# Patient Record
Sex: Male | Born: 1995 | Race: White | Hispanic: No | Marital: Single | Smoking: Current every day smoker
Health system: Southern US, Community
[De-identification: ages and names within clinical notes are randomized; demographics above are authoritative.]

## PROBLEM LIST (undated history)

## (undated) DIAGNOSIS — F909 Attention-deficit hyperactivity disorder, unspecified type: Secondary | ICD-10-CM

---
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
## 2014-04-16 ENCOUNTER — Encounter (HOSPITAL_COMMUNITY): Payer: Self-pay | Admitting: Cardiology

## 2014-04-16 ENCOUNTER — Emergency Department (HOSPITAL_COMMUNITY): Payer: Medicaid Other

## 2014-04-16 ENCOUNTER — Emergency Department (HOSPITAL_COMMUNITY)
Admission: EM | Admit: 2014-04-16 | Discharge: 2014-04-16 | Disposition: A | Discharge status: Home / Self Care | Payer: Medicaid Other | Attending: Emergency Medicine | Admitting: Emergency Medicine

## 2014-04-16 DIAGNOSIS — R197 Diarrhea, unspecified: Secondary | ICD-10-CM | POA: Diagnosis present

## 2014-04-16 DIAGNOSIS — Z720 Tobacco use: Secondary | ICD-10-CM | POA: Diagnosis not present

## 2014-04-16 DIAGNOSIS — Z8659 Personal history of other mental and behavioral disorders: Secondary | ICD-10-CM | POA: Diagnosis not present

## 2014-04-16 DIAGNOSIS — B349 Viral infection, unspecified: Secondary | ICD-10-CM | POA: Insufficient documentation

## 2014-04-16 MED ORDER — ONDANSETRON 4 MG PO TBDP: 4.0000 mg | ORAL_TABLET | Freq: Four times a day (QID) | ORAL | Status: DC | PRN

## 2014-04-16 MED ORDER — SODIUM CHLORIDE 0.9 % IV SOLN
1000.0000 mL | Freq: Once | INTRAVENOUS | Status: AC
  Administered 2014-04-16: 1000 mL via INTRAVENOUS

## 2014-04-16 MED ORDER — KETOROLAC TROMETHAMINE 30 MG/ML IJ SOLN
30.0000 mg | Freq: Once | INTRAMUSCULAR | Status: AC
  Administered 2014-04-16: 30 mg via INTRAVENOUS
  Filled 2014-04-16: qty 1

## 2014-04-16 MED ORDER — SODIUM CHLORIDE 0.9 % IV SOLN
1000.0000 mL | INTRAVENOUS | Status: DC
  Administered 2014-04-16: 1000 mL via INTRAVENOUS

## 2014-04-16 MED ORDER — ONDANSETRON HCL 4 MG/2ML IJ SOLN
4.0000 mg | Freq: Once | INTRAMUSCULAR | Status: AC
  Administered 2014-04-16: 4 mg via INTRAVENOUS
  Filled 2014-04-16: qty 2

## 2014-04-16 NOTE — ED Notes (Signed)
Nausea and diarrhea since yesterday morning.

## 2014-04-16 NOTE — ED Provider Notes (Signed)
CSN: [id number]     Arrival date & time 04/16/14  4098 History   First MD Initiated Contact with Patient 04/16/14 [phone]     Chief Complaint  Patient presents with  . Diarrhea     (Consider location/radiation/quality/duration/timing/severity/associated sxs/prior Treatment) Patient is a 19 y.o. male presenting with diarrhea. The history is provided by the patient.  Diarrhea Quality:  Watery Severity:  Moderate Number of episodes:  3 Duration:  1 day Timing:  Intermittent Progression:  Worsening Relieved by:  Nothing Ineffective treatments:  None tried Associated symptoms: chills, fever and headaches   Associated symptoms: no abdominal pain and no vomiting   Associated symptoms comment:  Nausea, weak, feeling tired and sleepy Risk factors: sick contacts   Risk factors: no recent antibiotic use, no suspicious food intake and no travel to endemic areas     Past Medical History  Diagnosis Date  . ADHD (attention deficit hyperactivity disorder)    History reviewed. No pertinent past surgical history. History reviewed. No pertinent family history. History  Substance Use Topics  . Smoking status: Current Every Day Smoker  . Smokeless tobacco: Not on file  . Alcohol Use: Yes     Comment: occasional    Review of Systems  Constitutional: Positive for fever and chills.  Gastrointestinal: Positive for nausea and diarrhea. Negative for vomiting, abdominal pain and blood in stool.  Neurological: Positive for headaches.      Allergies  Review of patient's allergies indicates no known allergies.  Home Medications   Prior to Admission medications   Medication Sig Start Date End Date Taking? Authorizing Provider  HYDROcodone-acetaminophen (NORCO/VICODIN) 5-325 MG per tablet Take 1 tablet by mouth every 4 (four) hours as needed for moderate pain or severe pain. 08/05/13   Zadie Rhine, MD  ibuprofen (ADVIL,MOTRIN) 600 MG tablet Take 1 tablet (600 mg total) by mouth every 6 (six)  hours as needed. 08/05/13   Zadie Rhine, MD   BP 114/99 mmHg  Pulse 63  Temp(Src) 98.8 F (37.1 C) (Oral)  Resp 14  Ht  (1.727 m)  Wt 200 lb (90.719 kg)  BMI 30.42 kg/m2  SpO2 96% Physical Exam  Constitutional: He is oriented to person, place, and time. He appears well-developed and well-nourished.  Non-toxic appearance.  HENT:  Head: Normocephalic.  Right Ear: Tympanic membrane and external ear normal.  Left Ear: Tympanic membrane and external ear normal.  Eyes: EOM and lids are normal. Pupils are equal, round, and reactive to light.  Neck: Normal range of motion. Neck supple. Carotid bruit is not present.  Cardiovascular: Normal rate, regular rhythm, normal heart sounds, intact distal pulses and normal pulses.   Pulmonary/Chest: No accessory muscle usage. No apnea and no tachypnea. No respiratory distress. He has rhonchi.  Abdominal: Soft. Bowel sounds are normal. There is no tenderness. There is no guarding.  Musculoskeletal: Normal range of motion.  Lymphadenopathy:       Head (right side): No submandibular adenopathy present.       Head (left side): No submandibular adenopathy present.    He has no cervical adenopathy.  Neurological: He is alert and oriented to person, place, and time. He has normal strength. No cranial nerve deficit or sensory deficit.  Skin: Skin is warm and dry.  Psychiatric: He has a normal mood and affect. His speech is normal.  Nursing note and vitals reviewed.   ED Course  Procedures (including critical care time) Labs Review Labs Reviewed - No data to display  Imaging Review No results found.   EKG Interpretation None      MDM  Vital signs stable.  No nausea or diarrhea after zofran and IV fluids. After fluids and meds, pt ambulatory without problem. States he feels better. Chest xray is negative for acute problem.   Final diagnoses:  None    *I have reviewed nursing notes, vital signs, and all appropriate lab and imaging  results for this patient.[address]**    Ohanna Gassert, PA-C 04/18/14 0001  Shon Batonourtney F Horton, MD 04/19/14 1452

## 2014-04-16 NOTE — Discharge Instructions (Signed)

## 2014-04-23 ENCOUNTER — Emergency Department (HOSPITAL_COMMUNITY)
Admission: EM | Admit: 2014-04-23 | Discharge: 2014-04-23 | Disposition: A | Discharge status: Home / Self Care | Payer: Medicaid Other | Attending: Emergency Medicine | Admitting: Emergency Medicine

## 2014-04-23 ENCOUNTER — Encounter (HOSPITAL_COMMUNITY): Payer: Self-pay | Admitting: *Deleted

## 2014-04-23 DIAGNOSIS — B349 Viral infection, unspecified: Secondary | ICD-10-CM | POA: Diagnosis not present

## 2014-04-23 DIAGNOSIS — Z791 Long term (current) use of non-steroidal anti-inflammatories (NSAID): Secondary | ICD-10-CM | POA: Diagnosis not present

## 2014-04-23 DIAGNOSIS — Z79899 Other long term (current) drug therapy: Secondary | ICD-10-CM | POA: Diagnosis not present

## 2014-04-23 DIAGNOSIS — Z720 Tobacco use: Secondary | ICD-10-CM | POA: Insufficient documentation

## 2014-04-23 DIAGNOSIS — Z8659 Personal history of other mental and behavioral disorders: Secondary | ICD-10-CM | POA: Insufficient documentation

## 2014-04-23 DIAGNOSIS — R51 Headache: Secondary | ICD-10-CM | POA: Diagnosis present

## 2014-04-23 NOTE — ED Notes (Signed)
Alert, NAd, says he needs a work note, left work earlier today due to headache.  Needs a note to return to work.

## 2014-04-23 NOTE — Discharge Instructions (Signed)
Please use Tylenol every 4 hours, or ibuprofen every 6 hours for headache. Please increase fluids (water and juices and Gatorade). Please see Dr Lysbeth GalasNyland for follow up in the office of your illness. Viral Infections A virus is a type of germ. Viruses can cause:  Minor sore throats.  Aches and pains.  Headaches.  Runny nose.  Rashes.  Watery eyes.  Tiredness.  Coughs.  Loss of appetite.  Feeling sick to your stomach (nausea).  Throwing up (vomiting).  Watery poop (diarrhea). HOME CARE   Only take medicines as told by your doctor.  Drink enough water and fluids to keep your pee (urine) clear or pale yellow. Sports drinks are a good choice.  Get plenty of rest and eat healthy. Soups and broths with crackers or rice are fine. GET HELP RIGHT AWAY IF:   You have a very bad headache.  You have shortness of breath.  You have chest pain or neck pain.  You have an unusual rash.  You cannot stop throwing up.  You have watery poop that does not stop.  You cannot keep fluids down.  You or your child has a temperature by mouth above 102 F (38.9 C), not controlled by medicine.  Your baby is older than 3 months with a rectal temperature of 102 F (38.9 C) or higher.  Your baby is 943 months old or younger with a rectal temperature of 100.4 F (38 C) or higher. MAKE SURE YOU:   Understand these instructions.  Will watch this condition.  Will get help right away if you are not doing well or get worse. Document Released: 12/04/2007 Document Revised: 03/15/2011 Document Reviewed: 04/28/2010 [facility] Patient Information 2015 [place], [place]. This information is not intended to replace advice given to you by your health care provider. Make sure you discuss any questions you have with your health care provider.

## 2014-04-23 NOTE — ED Notes (Signed)
Pt states he started a new job yesterday and tonight he went to work. Pt is supposed to wear a pair of safety glasses. Pt states they made him have a headache so pt had to leave work. Pt states he went home and had 1 episode of emesis. Pt states his boss told him he would need a note to come back tomorrow. Pt is here for a work note.

## 2014-04-23 NOTE — ED Provider Notes (Signed)
CSN: [id number]     Arrival date & time 04/23/14  2053 History   First MD Initiated Contact with Patient 04/23/14 2216     Chief Complaint  Patient presents with  . work note      (Consider location/radiation/quality/duration/timing/severity/associated sxs/prior Treatment) HPI Comments: Patient is a 19 year old male who presents to the emergency department with a complaint of headache, nausea, and vomiting. This started on yesterday while the patient was at work. The patient states that he thinks that some of this may have been stimulated because he was in a very high heat area. He also feels that the safety glasses that he had to wear may have fostered his headache. He states the headache was mostly between his eyes, and felt like a great guilt of pressure. This was then accompanied by a tight sensation in the temporal areas. The patient had an episode of vomiting. He had episode of generally not feeling well for several hours on yesterday and this carried over into today. He states that he was told he would need a work note to return to work duty. He states he was unable to see his primary physician and came here since he was recently seen on April 12 for diarrhea and viral problems.  The history is provided by the patient.    Past Medical History  Diagnosis Date  . ADHD (attention deficit hyperactivity disorder)    History reviewed. No pertinent past surgical history. History reviewed. No pertinent family history. History  Substance Use Topics  . Smoking status: Current Every Day Smoker  . Smokeless tobacco: Not on file  . Alcohol Use: Yes     Comment: occasional    Review of Systems  Gastrointestinal: Positive for nausea and vomiting.  Neurological: Positive for headaches.  All other systems reviewed and are negative.     Allergies  Review of patient's allergies indicates no known allergies.  Home Medications   Prior to Admission medications   Medication Sig Start Date  End Date Taking? Authorizing Provider  acetaminophen (TYLENOL) 500 MG tablet Take 1,000 mg by mouth every 6 (six) hours as needed for mild pain, fever or headache.    Historical Provider, MD  bismuth subsalicylate (PEPTO BISMOL) 262 MG/15ML suspension Take 30 mLs by mouth every 6 (six) hours as needed (nausea/upset stomach).    Historical Provider, MD  HYDROcodone-acetaminophen (NORCO/VICODIN) 5-325 MG per tablet Take 1 tablet by mouth every 4 (four) hours as needed for moderate pain or severe pain. Patient not taking: Reported on 04/16/2014 08/05/13   Zadie Rhineonald Wickline, MD  ibuprofen (ADVIL,MOTRIN) 600 MG tablet Take 1 tablet (600 mg total) by mouth every 6 (six) hours as needed. Patient not taking: Reported on 04/16/2014 08/05/13   Zadie Rhineonald Wickline, MD  ondansetron (ZOFRAN ODT) 4 MG disintegrating tablet Take 1 tablet (4 mg total) by mouth every 6 (six) hours as needed for nausea or vomiting. 04/16/14   Ivery QualeHobson Caty Tessler, PA-C  Pseudoeph-Doxylamine-DM-APAP (DAYQUIL/NYQUIL COLD/FLU RELIEF PO) Take 2 capsules by mouth every 6 (six) hours as needed (cold/flu-like symptoms).    Historical Provider, MD   BP 130/66 mmHg  Pulse 88  Temp(Src) 98.6 F (37 C) (Oral)  Resp 18  Ht 5\' 8"  (1.727 m)  Wt 200 lb (90.719 kg)  BMI 30.42 kg/m2  SpO2 100% Physical Exam  Constitutional: He is oriented to person, place, and time. He appears well-developed and well-nourished.  Non-toxic appearance.  HENT:  Head: Normocephalic.  Right Ear: Tympanic membrane and external ear normal.  Left Ear: Tympanic membrane and external ear normal.  Eyes: EOM and lids are normal. Pupils are equal, round, and reactive to light.  Neck: Normal range of motion. Neck supple. Carotid bruit is not present.  Cardiovascular: Normal rate, regular rhythm, normal heart sounds, intact distal pulses and normal pulses.   Pulmonary/Chest: Breath sounds normal. No respiratory distress.  Abdominal: Soft. Bowel sounds are normal. There is no tenderness.  There is no guarding.  Musculoskeletal: Normal range of motion.  Lymphadenopathy:       Head (right side): No submandibular adenopathy present.       Head (left side): No submandibular adenopathy present.    He has no cervical adenopathy.  Neurological: He is alert and oriented to person, place, and time. He has normal strength. No cranial nerve deficit or sensory deficit.  Skin: Skin is warm and dry.  Psychiatric: He has a normal mood and affect. His speech is normal.  Nursing note and vitals reviewed.   ED Course  Procedures (including critical care time) Labs Review Labs Reviewed - No data to display  Imaging Review No results found.   EKG Interpretation None      MDM  Patient reports onset of headache and vomiting on yesterday. He had body aches and generally not feeling well on yesterday and carried over into today. At this time the vital signs are well within normal limits. Pulse oximetry is 100% on room air. No acute findings on the examination. Feels that it is safe for the patient to return to work duty on tomorrow April 20.    Final diagnoses:  None    **I have reviewed nursing notes, vital signs, and all appropriate lab and imaging results for this patient.    Ivery Quale, PA-C 04/23/14 2247  Raeford Razor, MD 04/23/14 [id number]

## 2014-04-24 ENCOUNTER — Encounter (HOSPITAL_COMMUNITY): Payer: Self-pay

## 2014-04-24 ENCOUNTER — Emergency Department (HOSPITAL_COMMUNITY)
Admission: EM | Admit: 2014-04-24 | Discharge: 2014-04-24 | Disposition: A | Discharge status: Home / Self Care | Payer: Medicaid Other | Attending: Emergency Medicine | Admitting: Emergency Medicine

## 2014-04-24 DIAGNOSIS — Z720 Tobacco use: Secondary | ICD-10-CM | POA: Diagnosis not present

## 2014-04-24 DIAGNOSIS — R0981 Nasal congestion: Secondary | ICD-10-CM | POA: Insufficient documentation

## 2014-04-24 DIAGNOSIS — R51 Headache: Secondary | ICD-10-CM | POA: Insufficient documentation

## 2014-04-24 DIAGNOSIS — Z79899 Other long term (current) drug therapy: Secondary | ICD-10-CM | POA: Insufficient documentation

## 2014-04-24 DIAGNOSIS — Z8659 Personal history of other mental and behavioral disorders: Secondary | ICD-10-CM | POA: Insufficient documentation

## 2014-04-24 DIAGNOSIS — K088 Other specified disorders of teeth and supporting structures: Secondary | ICD-10-CM | POA: Insufficient documentation

## 2014-04-24 DIAGNOSIS — K0889 Other specified disorders of teeth and supporting structures: Secondary | ICD-10-CM

## 2014-04-24 MED ORDER — IBUPROFEN 600 MG PO TABS: 600.0000 mg | ORAL_TABLET | Freq: Four times a day (QID) | ORAL | Status: DC | PRN

## 2014-04-24 MED ORDER — IBUPROFEN 800 MG PO TABS
800.0000 mg | ORAL_TABLET | Freq: Once | ORAL | Status: AC
  Administered 2014-04-24: 800 mg via ORAL
  Filled 2014-04-24: qty 1

## 2014-04-24 MED ORDER — AMOXICILLIN 500 MG PO CAPS: 500.0000 mg | ORAL_CAPSULE | Freq: Three times a day (TID) | ORAL | Status: DC

## 2014-04-24 MED ORDER — AMOXICILLIN 250 MG PO CAPS
500.0000 mg | ORAL_CAPSULE | Freq: Once | ORAL | Status: AC
  Administered 2014-04-24: 500 mg via ORAL
  Filled 2014-04-24: qty 2

## 2014-04-24 NOTE — ED Notes (Signed)
Pt reports was here yesterday for v/d and requesting a note for work.  Reports today has a toothache and wants to be evaluated for that as well.

## 2014-04-24 NOTE — ED Provider Notes (Signed)
CSN: [id number]     Arrival date & time 04/24/14  1628 History   None    Chief Complaint  Patient presents with  . Dental Pain     (Consider location/radiation/quality/duration/timing/severity/associated sxs/prior Treatment) Patient is a 19 y.o. male presenting with tooth pain. The history is provided by the patient.  Dental Pain Location:  Upper Quality:  Aching Severity:  Moderate Onset quality:  Gradual Duration:  1 day Timing:  Constant Progression:  Worsening Chronicity:  Chronic Context: dental caries and poor dentition   Relieved by:  Nothing Ineffective treatments:  None tried Associated symptoms: congestion and headaches   Associated symptoms: no difficulty swallowing and no trismus   Risk factors: lack of dental care   Risk factors: no immunosuppression     Past Medical History  Diagnosis Date  . ADHD (attention deficit hyperactivity disorder)    History reviewed. No pertinent past surgical history. No family history on file. History  Substance Use Topics  . Smoking status: Current Every Day Smoker  . Smokeless tobacco: Not on file  . Alcohol Use: Yes     Comment: occasional    Review of Systems  HENT: Positive for congestion and dental problem.   Neurological: Positive for headaches.  All other systems reviewed and are negative.     Allergies  Review of patient's allergies indicates no known allergies.  Home Medications   Prior to Admission medications   Medication Sig Start Date End Date Taking? Authorizing Provider  acetaminophen (TYLENOL) 500 MG tablet Take 1,000 mg by mouth every 6 (six) hours as needed for mild pain, fever or headache.   Yes Historical Provider, MD  bismuth subsalicylate (PEPTO BISMOL) 262 MG/15ML suspension Take 30 mLs by mouth every 6 (six) hours as needed (nausea/upset stomach).    Historical Provider, MD  HYDROcodone-acetaminophen (NORCO/VICODIN) 5-325 MG per tablet Take 1 tablet by mouth every 4 (four) hours as needed  for moderate pain or severe pain. Patient not taking: Reported on 04/16/2014 08/05/13   Zadie Rhine, MD  ibuprofen (ADVIL,MOTRIN) 600 MG tablet Take 1 tablet (600 mg total) by mouth every 6 (six) hours as needed. Patient not taking: Reported on 04/16/2014 08/05/13   Zadie Rhine, MD  ondansetron (ZOFRAN ODT) 4 MG disintegrating tablet Take 1 tablet (4 mg total) by mouth every 6 (six) hours as needed for nausea or vomiting. Patient not taking: Reported on 04/24/2014 04/16/14   Ivery Quale, PA-C  Pseudoeph-Doxylamine-DM-APAP (DAYQUIL/NYQUIL COLD/FLU RELIEF PO) Take 2 capsules by mouth every 6 (six) hours as needed (cold/flu-like symptoms).    Historical Provider, MD   BP 123/63 mmHg  Pulse 88  Temp(Src) 99 F (37.2 C) (Oral)  Resp 14  Ht  (1.727 m)  Wt 200 lb (90.719 kg)  BMI 30.42 kg/m2  SpO2 100% Physical Exam  Constitutional: He is oriented to person, place, and time. He appears well-developed and well-nourished.  Non-toxic appearance.  HENT:  Head: Normocephalic.  Right Ear: Tympanic membrane and external ear normal.  Left Ear: Tympanic membrane and external ear normal.  Mild swelling of the left lower gum. No abscess. Tender to percussion of the 1st ans 2nd molar. Airway patent.  Eyes: EOM and lids are normal. Pupils are equal, round, and reactive to light.  Neck: Normal range of motion. Neck supple. Carotid bruit is not present.  Cardiovascular: Normal rate, regular rhythm, normal heart sounds, intact distal pulses and normal pulses.   Pulmonary/Chest: Breath sounds normal. No respiratory distress.  Abdominal: Soft. Bowel  sounds are normal. There is no tenderness. There is no guarding.  Musculoskeletal: Normal range of motion.  Lymphadenopathy:       Head (right side): No submandibular adenopathy present.       Head (left side): No submandibular adenopathy present.    He has no cervical adenopathy.  Neurological: He is alert and oriented to person, place, and time. He has  normal strength. No cranial nerve deficit or sensory deficit.  Skin: Skin is warm and dry.  Psychiatric: He has a normal mood and affect. His speech is normal.  Nursing note and vitals reviewed.   ED Course  Procedures (including critical care time) Labs Review Labs Reviewed - No data to display  Imaging Review No results found.   EKG Interpretation None      MDM  Vital signs stable. Pt c/o toothache left lower jaw area. Pt treated with ibuprofen and amoxil. Pt was seen in ED yesterday for upper respiratory symptoms and requesting a work note. Discussed with pt need for evaluation with PCP for non-emergent medical needs and work notes.   Final diagnoses:  None    **I have reviewed nursing notes, vital signs, and all appropriate lab and imaging results for this patient.Ivery Quale*    Jayden Rudge, PA-C 04/24/14 1806  Samuel JesterKathleen McManus, DO 04/25/14 2215

## 2014-04-24 NOTE — Discharge Instructions (Signed)
Use ibuprofen and amoxil for dental pain. See Dr Lysbeth GalasNyland for any additional medical issues or work notes. Dental Pain A tooth ache may be caused by cavities (tooth decay). Cavities expose the nerve of the tooth to air and hot or cold temperatures. It may come from an infection or abscess (also called a boil or furuncle) around your tooth. It is also often caused by dental caries (tooth decay). This causes the pain you are having. DIAGNOSIS  Your caregiver can diagnose this problem by exam. TREATMENT   If caused by an infection, it may be treated with medications which kill germs (antibiotics) and pain medications as prescribed by your caregiver. Take medications as directed.  Only take over-the-counter or prescription medicines for pain, discomfort, or fever as directed by your caregiver.  Whether the tooth ache today is caused by infection or dental disease, you should see your dentist as soon as possible for further care. SEEK MEDICAL CARE IF: The exam and treatment you received today has been provided on an emergency basis only. This is not a substitute for complete medical or dental care. If your problem worsens or new problems (symptoms) appear, and you are unable to meet with your dentist, call or return to this location. SEEK IMMEDIATE MEDICAL CARE IF:   You have a fever.  You develop redness and swelling of your face, jaw, or neck.  You are unable to open your mouth.  You have severe pain uncontrolled by pain medicine. MAKE SURE YOU:   Understand these instructions.  Will watch your condition.  Will get help right away if you are not doing well or get worse. Document Released: 12/21/2004 Document Revised: 03/15/2011 Document Reviewed: 08/09/2007 [facility] Patient Information 2015 [employer], [place]. This information is not intended to replace advice given to you by your health care provider. Make sure you discuss any questions you have with your health care provider.

## 2015-07-16 ENCOUNTER — Emergency Department (HOSPITAL_COMMUNITY)
Admission: EM | Admit: 2015-07-16 | Discharge: 2015-07-16 | Disposition: A | Discharge status: Home / Self Care | Payer: Medicaid Other | Attending: Emergency Medicine | Admitting: Emergency Medicine

## 2015-07-16 ENCOUNTER — Encounter (HOSPITAL_COMMUNITY): Payer: Self-pay

## 2015-07-16 DIAGNOSIS — F909 Attention-deficit hyperactivity disorder, unspecified type: Secondary | ICD-10-CM | POA: Insufficient documentation

## 2015-07-16 DIAGNOSIS — Z792 Long term (current) use of antibiotics: Secondary | ICD-10-CM | POA: Insufficient documentation

## 2015-07-16 DIAGNOSIS — F17200 Nicotine dependence, unspecified, uncomplicated: Secondary | ICD-10-CM | POA: Insufficient documentation

## 2015-07-16 DIAGNOSIS — R1012 Left upper quadrant pain: Secondary | ICD-10-CM | POA: Insufficient documentation

## 2015-07-16 DIAGNOSIS — Z79899 Other long term (current) drug therapy: Secondary | ICD-10-CM | POA: Insufficient documentation

## 2015-07-16 LAB — URINALYSIS, ROUTINE W REFLEX MICROSCOPIC
Bilirubin Urine: NEGATIVE
Glucose, UA: NEGATIVE mg/dL
HGB URINE DIPSTICK: NEGATIVE
Ketones, ur: NEGATIVE mg/dL
LEUKOCYTES UA: NEGATIVE
NITRITE: NEGATIVE
PROTEIN: NEGATIVE mg/dL
Specific Gravity, Urine: 1.025 (ref 1.005–1.030)
pH: 6 (ref 5.0–8.0)

## 2015-07-16 NOTE — Discharge Instructions (Signed)

## 2015-07-16 NOTE — ED Notes (Signed)
Complain of upper abdominal pain. Denies vomiting and diarrhea. States he drank three beers last night.

## 2015-07-16 NOTE — ED Provider Notes (Signed)
CSN: [id number]     Arrival date & time 07/16/15  0912 History   First MD Initiated Contact with Patient 07/16/15 [phone]     Chief Complaint  Patient presents with  . Abdominal Pain     (Consider location/radiation/quality/duration/timing/severity/associated sxs/prior Treatment) Patient is a 20 y.o. male presenting with abdominal pain. The history is provided by the patient. No language interpreter was used.  Abdominal Pain Pain location:  LUQ Pain quality: aching   Pain radiates to:  Does not radiate Timing:  Constant Progression:  Worsening Chronicity:  New Context: not alcohol use   Relieved by:  Nothing Worsened by:  Nothing tried Ineffective treatments:  None tried Associated symptoms: no vomiting   Risk factors: has not had multiple surgeries     Past Medical History  Diagnosis Date  . ADHD (attention deficit hyperactivity disorder)    History reviewed. No pertinent past surgical history. No family history on file. Social History  Substance Use Topics  . Smoking status: Current Every Day Smoker  . Smokeless tobacco: None  . Alcohol Use: Yes     Comment: occasional    Review of Systems  Gastrointestinal: Positive for abdominal pain. Negative for vomiting.  All other systems reviewed and are negative.     Allergies  Review of patient's allergies indicates no known allergies.  Home Medications   Prior to Admission medications   Medication Sig Start Date End Date Taking? Authorizing Provider  acetaminophen (TYLENOL) 500 MG tablet Take 1,000 mg by mouth every 6 (six) hours as needed for mild pain, fever or headache.    Historical Provider, MD  amoxicillin (AMOXIL) 500 MG capsule Take 1 capsule (500 mg total) by mouth 3 (three) times daily. 04/24/14   Ivery QualeHobson Bryant, PA-C  bismuth subsalicylate (PEPTO BISMOL) 262 MG/15ML suspension Take 30 mLs by mouth every 6 (six) hours as needed (nausea/upset stomach).    Historical Provider, MD  HYDROcodone-acetaminophen  (NORCO/VICODIN) 5-325 MG per tablet Take 1 tablet by mouth every 4 (four) hours as needed for moderate pain or severe pain. Patient not taking: Reported on 04/16/2014 08/05/13   Zadie Rhineonald Wickline, MD  ibuprofen (ADVIL,MOTRIN) 600 MG tablet Take 1 tablet (600 mg total) by mouth every 6 (six) hours as needed. Use for toothache pain 04/24/14   Ivery QualeHobson Bryant, PA-C  ondansetron (ZOFRAN ODT) 4 MG disintegrating tablet Take 1 tablet (4 mg total) by mouth every 6 (six) hours as needed for nausea or vomiting. Patient not taking: Reported on 04/24/2014 04/16/14   Ivery QualeHobson Bryant, PA-C  Pseudoeph-Doxylamine-DM-APAP (DAYQUIL/NYQUIL COLD/FLU RELIEF PO) Take 2 capsules by mouth every 6 (six) hours as needed (cold/flu-like symptoms).    Historical Provider, MD   BP 141/88 mmHg  Pulse 86  Temp(Src) 98.1 F (36.7 C) (Oral)  Resp 16  Ht 5\' 9"  (1.753 m)  Wt 99.791 kg  BMI 32.47 kg/m2  SpO2 96% Physical Exam  Constitutional: He is oriented to person, place, and time. He appears well-developed and well-nourished.  HENT:  Head: Normocephalic.  Right Ear: External ear normal.  Left Ear: External ear normal.  Nose: Nose normal.  Mouth/Throat: Oropharynx is clear and moist.  Eyes: EOM are normal. Pupils are equal, round, and reactive to light.  Neck: Normal range of motion.  Cardiovascular: Normal rate.   Pulmonary/Chest: Effort normal and breath sounds normal.  Abdominal: Soft. He exhibits no distension. There is tenderness.  Musculoskeletal: Normal range of motion.  Neurological: He is alert and oriented to person, place, and time.  Skin:  Skin is warm.  Psychiatric: He has a normal mood and affect.  Nursing note and vitals reviewed.   ED Course  Procedures (including critical care time) Labs Review Labs Reviewed  CBC WITH DIFFERENTIAL/PLATELET  COMPREHENSIVE METABOLIC PANEL  LIPASE, BLOOD  URINALYSIS, ROUTINE W REFLEX MICROSCOPIC (NOT AT [facility])    Imaging Review No results found. I have personally  reviewed and evaluated these images and lab results as part of my medical decision-making.   EKG Interpretation None      MDM  Pt  refused blood work or further evaluation.  Pt reports pain has pasted.  Pt asked for a note for his parole officer.  Pt reports he missed parole meeting due to coming in here and request a note stating he was here.   Final diagnoses:  Left upper quadrant pain    Pt advised he can try pepcid.  I advised him if he is really having pain and it returns/worsens he need to return for evaluation.    Lonia Skinner [place], PA-C 07/16/15 1050  Jacalyn Lefevre, MD 07/16/15 [phone]

## 2019-05-26 ENCOUNTER — Other Ambulatory Visit: Payer: Self-pay

## 2019-05-26 ENCOUNTER — Encounter (HOSPITAL_COMMUNITY): Payer: Self-pay | Admitting: Emergency Medicine

## 2019-05-26 DIAGNOSIS — F17210 Nicotine dependence, cigarettes, uncomplicated: Secondary | ICD-10-CM | POA: Insufficient documentation

## 2019-05-26 DIAGNOSIS — R1031 Right lower quadrant pain: Secondary | ICD-10-CM | POA: Insufficient documentation

## 2019-05-26 DIAGNOSIS — N50811 Right testicular pain: Secondary | ICD-10-CM | POA: Insufficient documentation

## 2019-05-26 NOTE — ED Triage Notes (Signed)
Pt c/o testicle pain that started today.

## 2019-05-27 ENCOUNTER — Emergency Department (HOSPITAL_COMMUNITY)
Admission: EM | Admit: 2019-05-27 | Discharge: 2019-05-27 | Disposition: A | Discharge status: Home / Self Care | Payer: Self-pay | Attending: Emergency Medicine | Admitting: Emergency Medicine

## 2019-05-27 ENCOUNTER — Emergency Department (HOSPITAL_COMMUNITY): Payer: Self-pay

## 2019-05-27 DIAGNOSIS — N50811 Right testicular pain: Secondary | ICD-10-CM

## 2019-05-27 LAB — URINALYSIS, ROUTINE W REFLEX MICROSCOPIC
Bilirubin Urine: NEGATIVE
Glucose, UA: NEGATIVE mg/dL
Hgb urine dipstick: NEGATIVE
Ketones, ur: NEGATIVE mg/dL
Leukocytes,Ua: NEGATIVE
Nitrite: NEGATIVE
Protein, ur: NEGATIVE mg/dL
Specific Gravity, Urine: 1.021 (ref 1.005–1.030)
pH: 5 (ref 5.0–8.0)

## 2019-05-27 MED ORDER — DOXYCYCLINE HYCLATE 100 MG PO TABS
100.0000 mg | ORAL_TABLET | Freq: Once | ORAL | Status: AC
  Administered 2019-05-27: 100 mg via ORAL
  Filled 2019-05-27: qty 1

## 2019-05-27 NOTE — Discharge Instructions (Addendum)
Continue taking the antibiotic until it is all gone.  Take ibuprofen and/or acetaminophen as needed for pain.  Follow up with the urologist.

## 2019-05-27 NOTE — ED Provider Notes (Signed)
[facility] EMERGENCY DEPARTMENT Provider Note   CSN: [id number] Arrival date & time: 05/26/19  2048   History Chief Complaint  Patient presents with  . Testicle Pain    Jesse Monroe is a 24 y.o. male.  The history is provided by the patient.  Testicle Pain  He has history of ADHD and comes in complaining of right testicular pain.  He for started having pain in the right testicle about 10 days ago and was seen at a hospital in [place] where he was diagnosed with epididymitis.  There was no imaging done.  He was discharged with prescription for doxycycline.  About 2 days ago, the character of pain changed.  He is complaining of ongoing pain in the right testicle.  Pain is moderately severe and he rates it at 8/10.  There is occasional radiation of pain to the lower abdomen.  He denies nausea or vomiting.  Denies any difficulty urinating.  He is concerned that he had torsion that may have been missed.  Past Medical History:  Diagnosis Date  . ADHD (attention deficit hyperactivity disorder)     There are no problems to display for this patient.   History reviewed. No pertinent surgical history.     History reviewed. No pertinent family history.  Social History   Tobacco Use  . Smoking status: Current Every Day Smoker  Substance Use Topics  . Alcohol use: Yes    Comment: occasional  . Drug use: No    Home Medications Prior to Admission medications   Medication Sig Start Date End Date Taking? Authorizing Provider  acetaminophen (TYLENOL) 500 MG tablet Take 1,000 mg by mouth every 6 (six) hours as needed for mild pain, fever or headache.    [provider]  amoxicillin (AMOXIL) 500 MG capsule Take 1 capsule (500 mg total) by mouth 3 (three) times daily. Patient not taking: Reported on 07/16/2015 04/24/14   Ivery Quale, PA-C  bismuth subsalicylate (PEPTO BISMOL) 262 MG/15ML suspension Take 30 mLs by mouth every 6 (six) hours as needed (nausea/upset stomach).     [provider]  HYDROcodone-acetaminophen (NORCO/VICODIN) 5-325 MG per tablet Take 1 tablet by mouth every 4 (four) hours as needed for moderate pain or severe pain. Patient not taking: Reported on 04/16/2014 08/05/13   Zadie Rhine, MD  ibuprofen (ADVIL,MOTRIN) 600 MG tablet Take 1 tablet (600 mg total) by mouth every 6 (six) hours as needed. Use for toothache pain Patient not taking: Reported on 07/16/2015 04/24/14   Ivery Quale, PA-C  ondansetron (ZOFRAN ODT) 4 MG disintegrating tablet Take 1 tablet (4 mg total) by mouth every 6 (six) hours as needed for nausea or vomiting. Patient not taking: Reported on 04/24/2014 04/16/14   Ivery Quale, PA-C  Pseudoeph-Doxylamine-DM-APAP (DAYQUIL/NYQUIL COLD/FLU RELIEF PO) Take 2 capsules by mouth every 6 (six) hours as needed (cold/flu-like symptoms).    [provider]    Allergies    Patient has no known allergies.  Review of Systems   Review of Systems  Genitourinary: Positive for testicular pain.  All other systems reviewed and are negative.   Physical Exam Updated Vital Signs BP 129/75 (BP Location: Right Arm)   Pulse 99   Temp 98.2 F (36.8 C) (Oral)   Resp 18   Ht 5\' 9"  (1.753 m)   Wt 97.5 kg   SpO2 100%   BMI 31.75 kg/m   Physical Exam Vitals and nursing note reviewed.  Neurological:     Coordination: Abnormal coordination:  24 year old male, resting comfortably and in no acute distress. Vital signs are normal. Oxygen saturation is 100%, which is normal. Head is normocephalic and atraumatic. PERRLA, EOMI. Oropharynx is clear. Neck is nontender and supple without adenopathy or JVD. Back is nontender and there is no CVA tenderness. Lungs are clear without rales, wheezes, or rhonchi. Chest is nontender. Heart has regular rate and rhythm without murmur. Abdomen is soft, flat, nontender without masses or hepatosplenomegaly and peristalsis is normoactive. Genitalia: Penis partially circumcised.  Testes  descended without masses, induration, tenderness.  Testicular orientation is normal.  No scrotal masses are felt.  There is no inguinal adenopathy. Extremities have no cyanosis or edema, full range of motion is present. Skin is warm and dry without rash. Neurologic: Mental status is normal, cranial nerves are intact, there are no motor or sensory deficits.  ED Results / Procedures / Treatments   Labs (all labs ordered are listed, but only abnormal results are displayed) Labs Reviewed  URINALYSIS, ROUTINE W REFLEX MICROSCOPIC  BASIC METABOLIC PANEL  CBC WITH DIFFERENTIAL/PLATELET   Radiology CT Renal Stone Study  Result Date: 05/27/2019 CLINICAL DATA:  Right flank pain EXAM: CT ABDOMEN AND PELVIS WITHOUT CONTRAST TECHNIQUE: Multidetector CT imaging of the abdomen and pelvis was performed following the standard protocol without IV contrast. COMPARISON:  12/20/2018 FINDINGS: Lower chest: Lung bases are clear. Hepatobiliary: Unenhanced liver is unremarkable. Gallbladder is unremarkable. No intrahepatic or extrahepatic ductal dilatation. Pancreas: Within normal limits. Spleen: Within normal limits. Adrenals/Urinary Tract: Adrenal glands are within normal limits. Kidneys are within normal limits. No renal, ureteral, or bladder calculi. No hydronephrosis. Bladder is mildly thick-walled although underdistended. Stomach/Bowel: Stomach is within normal limits. No evidence of bowel obstruction. Appendix is mildly prominent in its midportion (series 2/image 36), but this is unchanged from the prior and without associated inflammatory changes. Vascular/Lymphatic: No evidence of abdominal aortic aneurysm. No suspicious abdominopelvic lymphadenopathy. Reproductive: Prostate is unremarkable. Other: No abdominopelvic ascites. Musculoskeletal: Visualized osseous structures are within normal limits. IMPRESSION: Negative CT abdomen/pelvis. Electronically Signed   By: Julian Hy M.D.   On: 05/27/2019 05:11   US  SCROTUM W/DOPPLER  Result Date: 05/27/2019 CLINICAL DATA:  Right testicle pain EXAM: SCROTAL ULTRASOUND DOPPLER ULTRASOUND OF THE TESTICLES TECHNIQUE: Complete ultrasound examination of the testicles, epididymis, and other scrotal structures was performed. Color and spectral Doppler ultrasound were also utilized to evaluate blood flow to the testicles. COMPARISON:  None. FINDINGS: Right testicle Measurements: 4.4 x 2.1 x 2.8 cm. No mass or microlithiasis visualized. Left testicle Measurements: 4.2 x 2.3 x 2.8 cm. No mass or microlithiasis visualized. Right epididymis:  Normal in size and appearance. Left epididymis:  Normal in size and appearance. Hydrocele:  There is small bilateral hydroceles. Varicocele:  There are borderline bilateral varicoceles. Pulsed Doppler interrogation of both testes demonstrates normal low resistance arterial and venous waveforms bilaterally. IMPRESSION: 1. No acute abnormality.  No evidence for testicular torsion. 2. There are small bilateral hydroceles. Electronically Signed   By: Constance Holster M.D.   On: 05/27/2019 03:03    Procedures Procedures   Medications Ordered in ED Medications  doxycycline (VIBRA-TABS) tablet 100 mg (has no administration in time range)    ED Course  I have reviewed the triage vital signs and the nursing notes.  Pertinent labs & imaging results that were available during my care of the patient were reviewed by me and considered in my medical decision making (see chart for details).  MDM Rules/Calculators/A&P Right testicular pain  which is now subacute.  Exam is not suggestive of epididymitis or torsion, but will get scrotal ultrasound with Dopplers.  Will check urinalysis and screening labs.  If initial work-up is negative, will get renal stone protocol CT scan to look for things that might cause referred pain to the testicle, such as urolithiasis.  Old records are reviewed, and he has no relevant past visits.  Urinalysis is normal.   Scrotal ultrasound showed no evidence of torsion or epididymitis, small bilateral hydroceles which are not felt to be clinically significant.  I have explained this to the patient and I have ordered a renal stone protocol CT scan.  Patient tells me that he had a CT scan in December.  I have looked up on [facility] PACS and he did have a CT of abdomen and pelvis on 12/20/2018 at which time there was a 4 x 2 mm calculus at the right ureterovesical junction without any other renal calculi.  We will proceed with CT scan today.  CT is unremarkable.  No obvious cause for right testicular pain.  Patient is advised of these findings and he is referred to urology for further outpatient evaluation.  Told to use over-the-counter analgesics as needed for pain.  Advised to complete his course of doxycycline.  Final Clinical Impression(s) / ED Diagnoses Final diagnoses:  Pain in right testicle    Rx / DC Orders ED Discharge Orders    None       Dione Booze, MD 05/27/19 [phone]

## 2021-07-12 IMAGING — US US SCROTUM W/ DOPPLER COMPLETE
1 series · 14 of 25 positions shown · non-contrast
Comparison: None.

CLINICAL DATA: Right testicle pain

EXAM:
SCROTAL ULTRASOUND
DOPPLER ULTRASOUND OF THE TESTICLES
TECHNIQUE: Complete ultrasound examination of the testicles, epididymis, and
other scrotal structures was performed. Color and spectral Doppler
ultrasound were also utilized to evaluate blood flow to the
testicles.

[Series 1: us scrotum w/ doppler complete · 0.06mm/px · 14 of 65 slices shown]
[im 1/65]
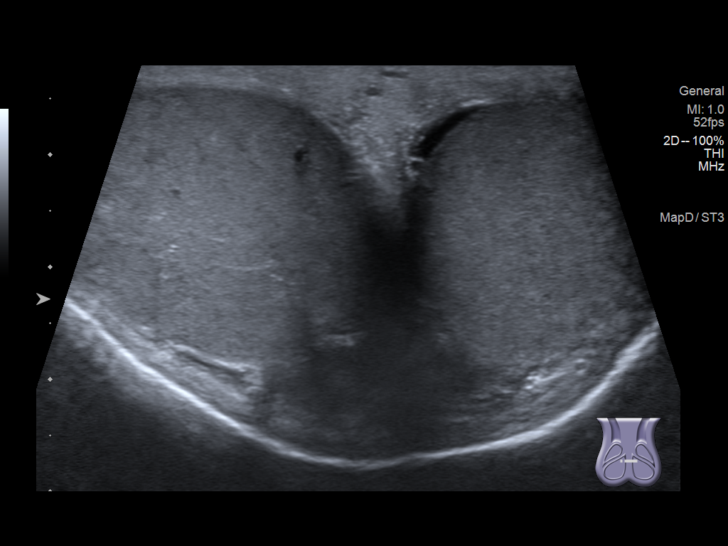
[im 6/65]
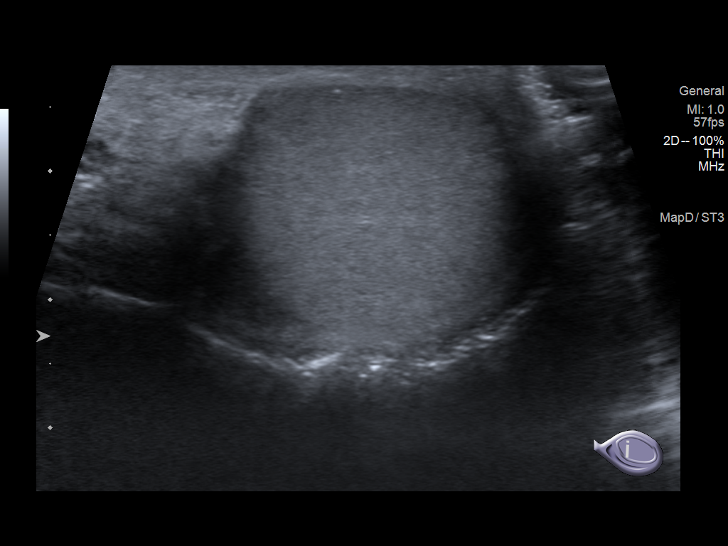
[im 11/65]
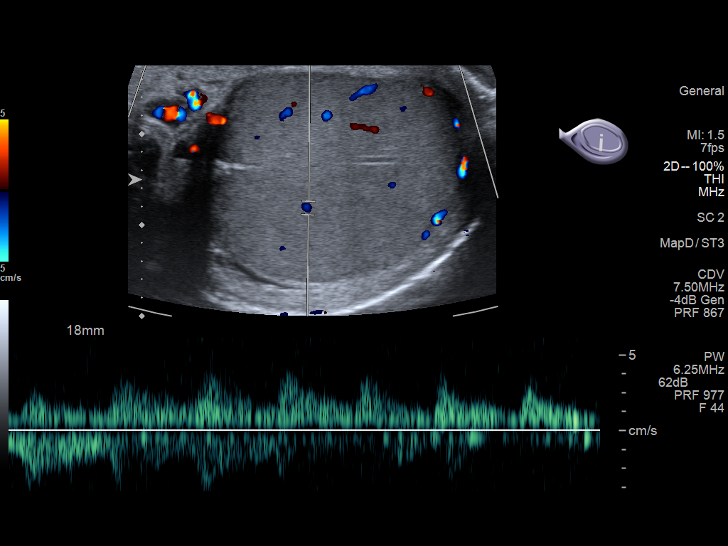
[im 17/65]
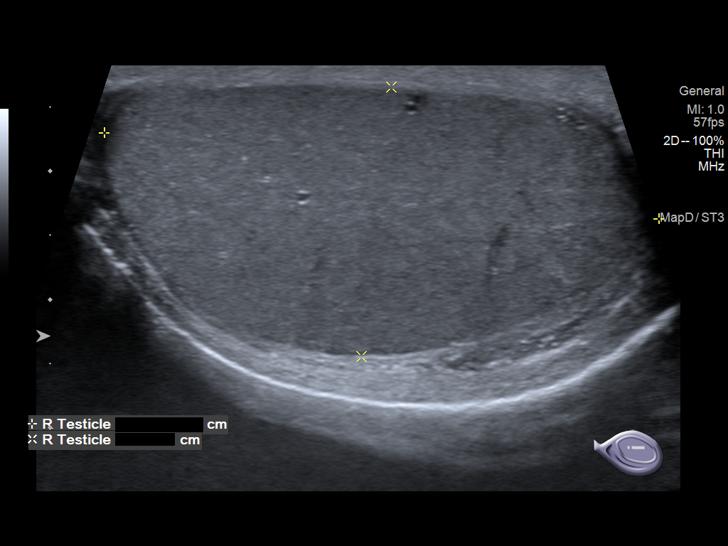
[im 22/65]
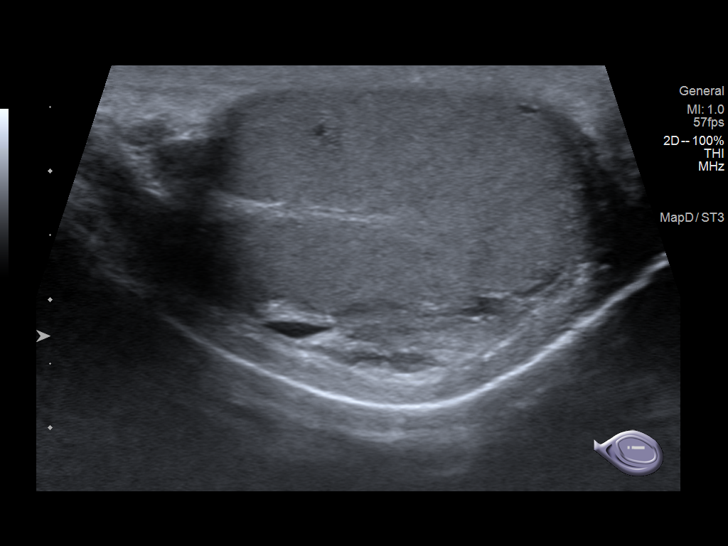
[im 25/65]
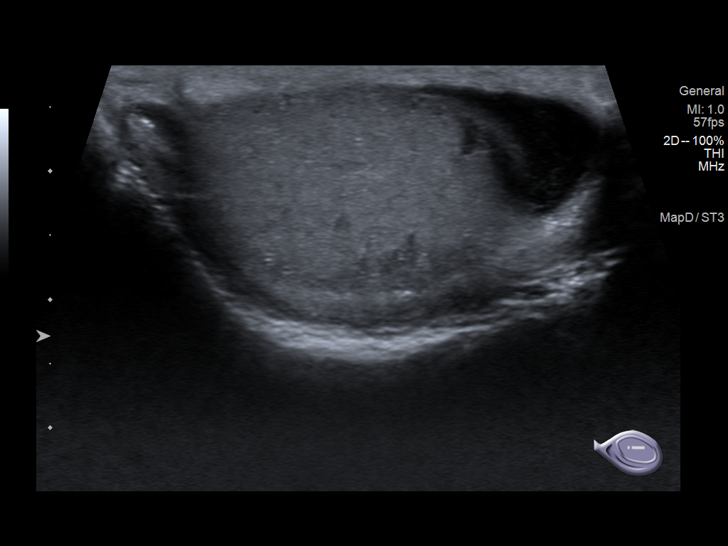
[im 30/65]
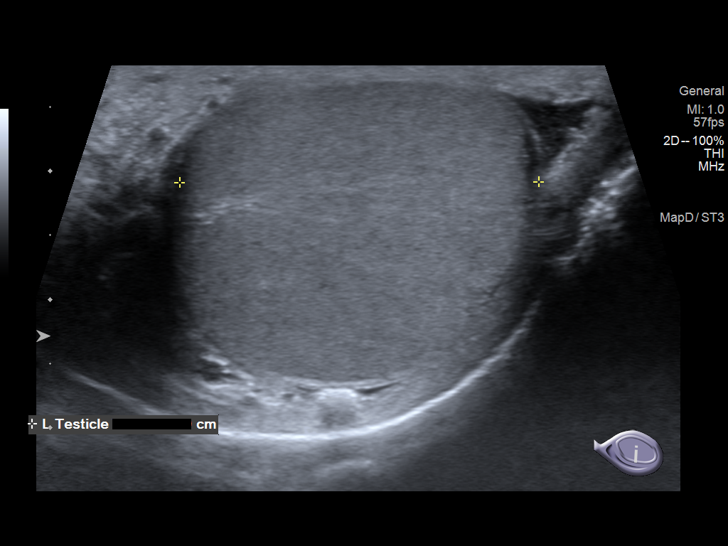
[im 35/65]
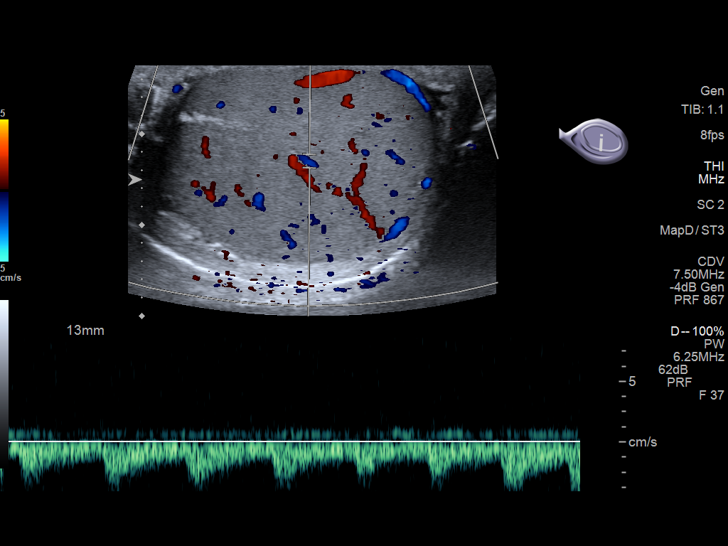
[im 41/65]
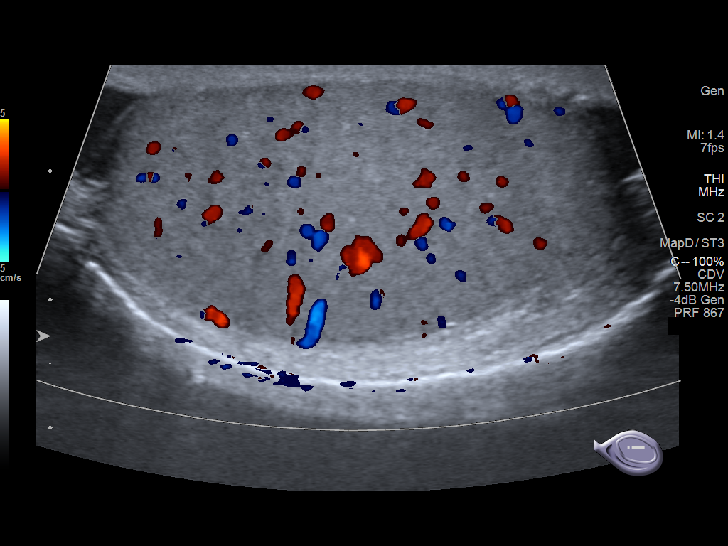
[im 43/65]
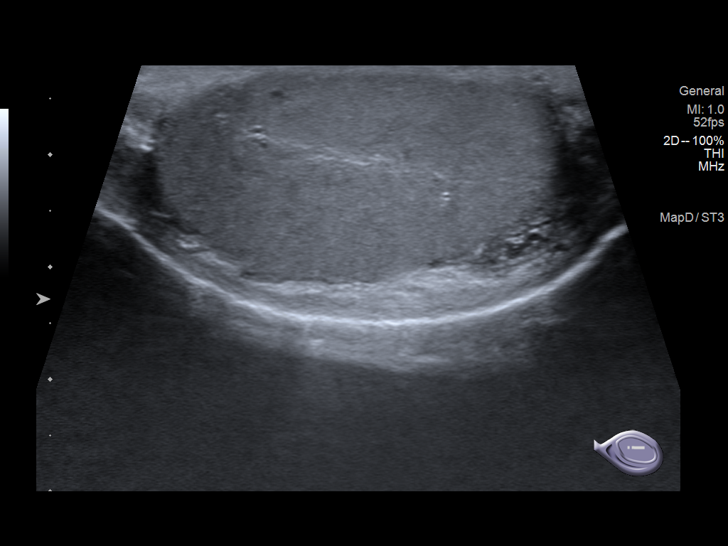
[im 49/65]
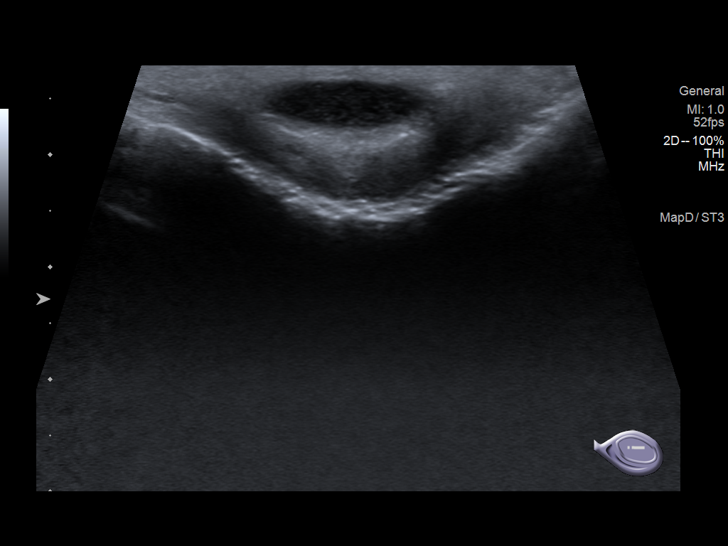
[im 54/65]
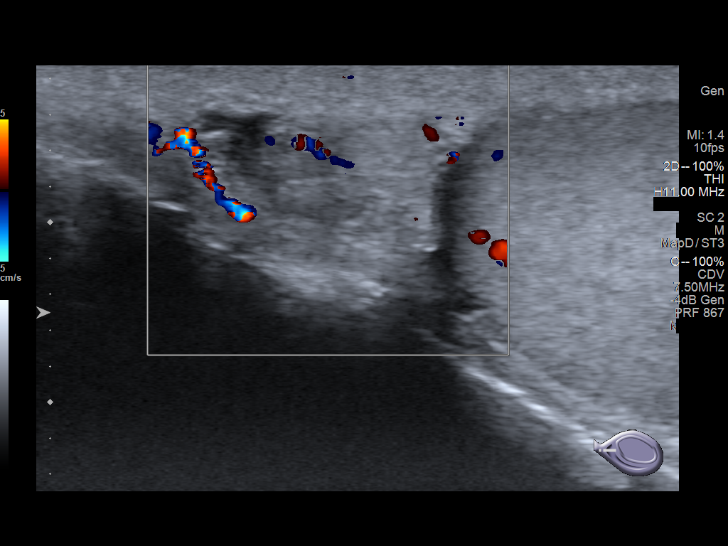
[im 59/65]
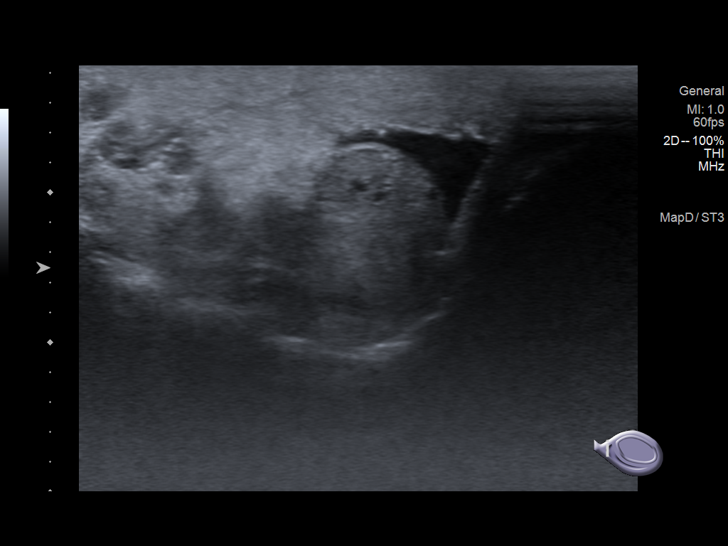
[im 65/65]
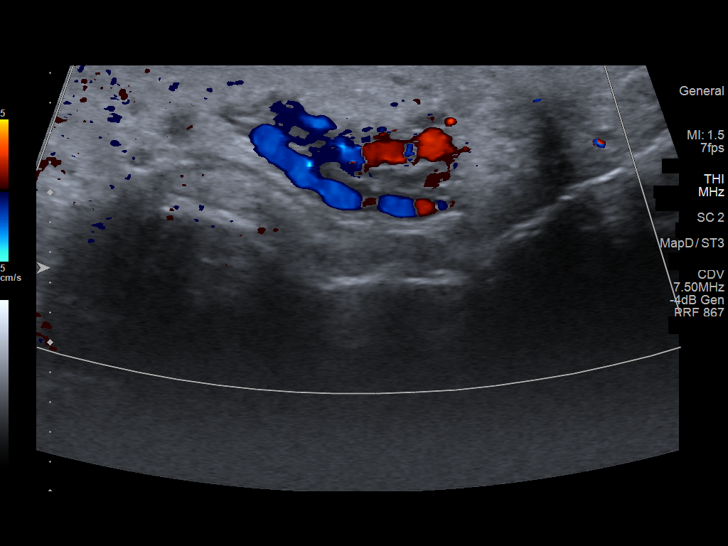

[14 of 25 positions shown; findings below may reference images not displayed]

FINDINGS: Right testicle

Measurements: 4.4 x 2.1 x 2.8 cm. No mass or microlithiasis
visualized.

Left testicle

Measurements: 4.2 x 2.3 x 2.8 cm. No mass or microlithiasis
visualized.

Right epididymis:  Normal in size and appearance.

Left epididymis:  Normal in size and appearance.

Hydrocele:  There is small bilateral hydroceles.

Varicocele:  There are borderline bilateral varicoceles.

Pulsed Doppler interrogation of both testes demonstrates normal low
resistance arterial and venous waveforms bilaterally.
IMPRESSION: 1. No acute abnormality.  No evidence for testicular torsion.
2. There are small bilateral hydroceles.

## 2021-07-12 IMAGING — CT CT RENAL STONE PROTOCOL
2 of 4 series · 16 of 46 positions shown, 18 images · non-contrast
Comparison: 12/20/2018

CLINICAL DATA: Right flank pain

EXAM:
CT ABDOMEN AND PELVIS WITHOUT CONTRAST
TECHNIQUE: Multidetector CT imaging of the abdomen and pelvis was performed
following the standard protocol without IV contrast.

[Series 2: axial st · axial · 0.85mm/px · z∈[+652,+1127]mm · 13 of 109 slices shown, 15 images]
[im 7/109  soft-tissue]
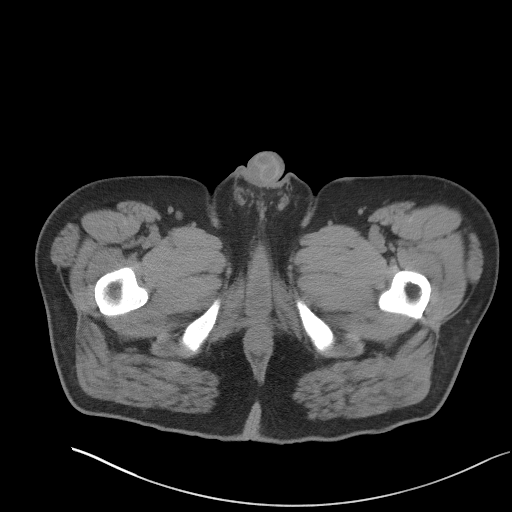
[im 7/109  bone]
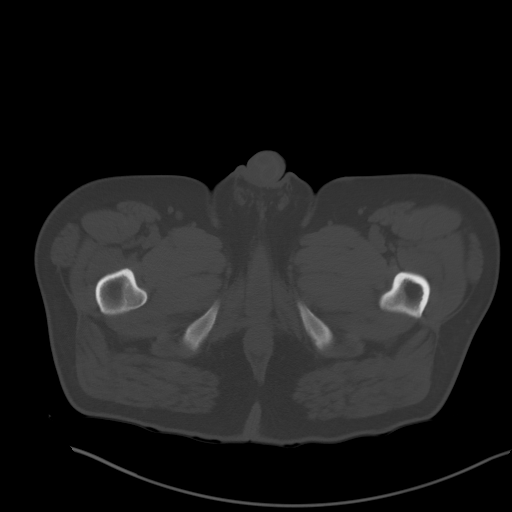
[im 14/109  soft-tissue]
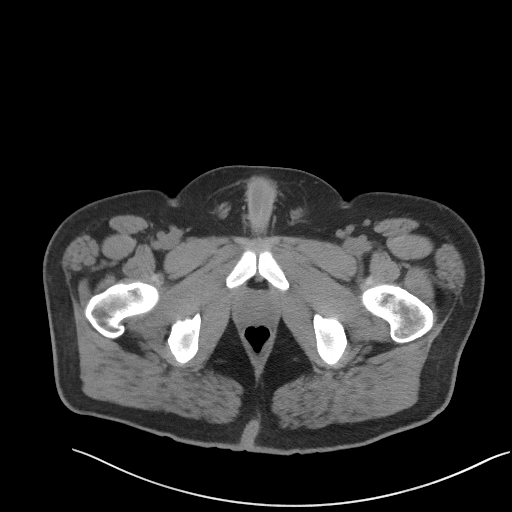
[im 21/109  soft-tissue]
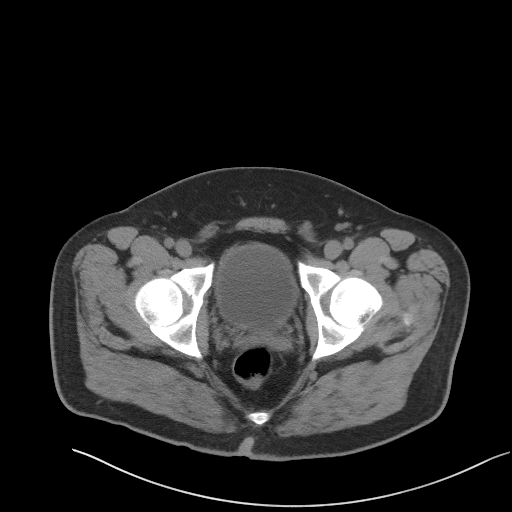
[im 34/109  soft-tissue]
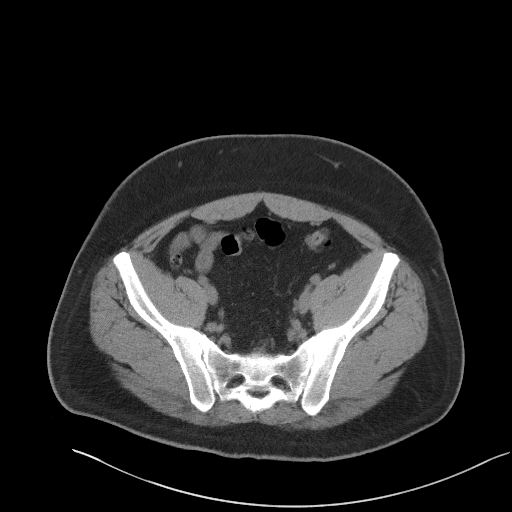
[im 41/109  soft-tissue]
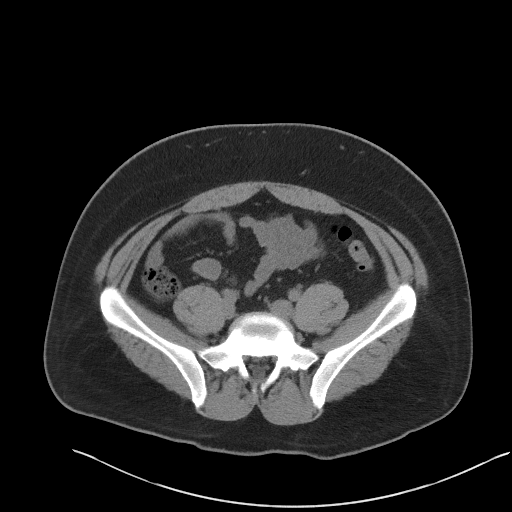
[im 48/109  soft-tissue]
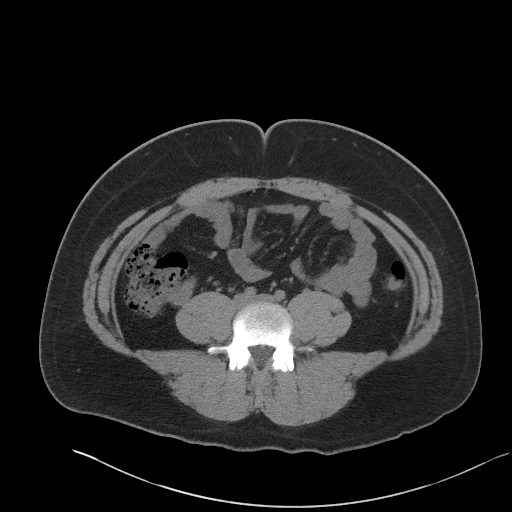
[im 55/109  soft-tissue]
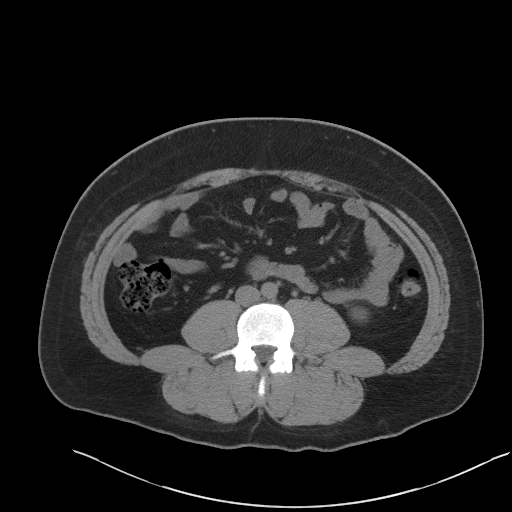
[im 61/109  soft-tissue]
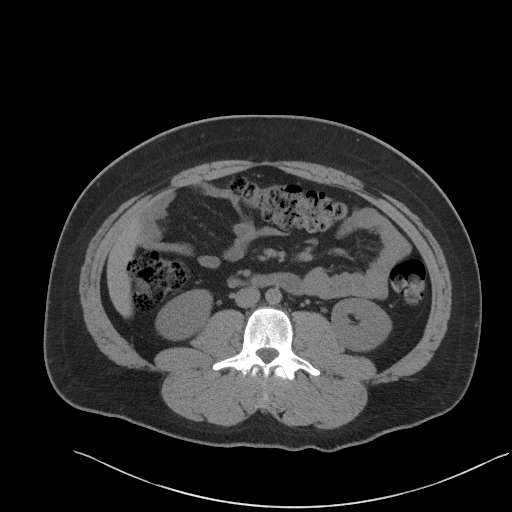
[im 68/109  soft-tissue]
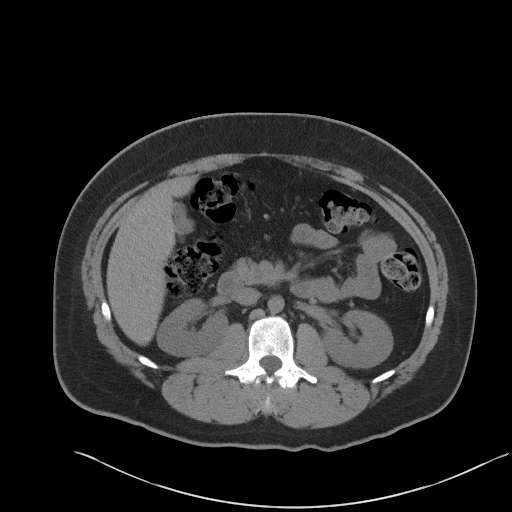
[im 68/109  bone]
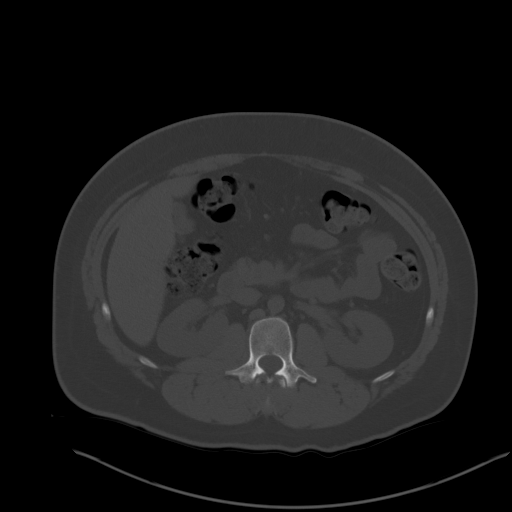
[im 75/109  soft-tissue]
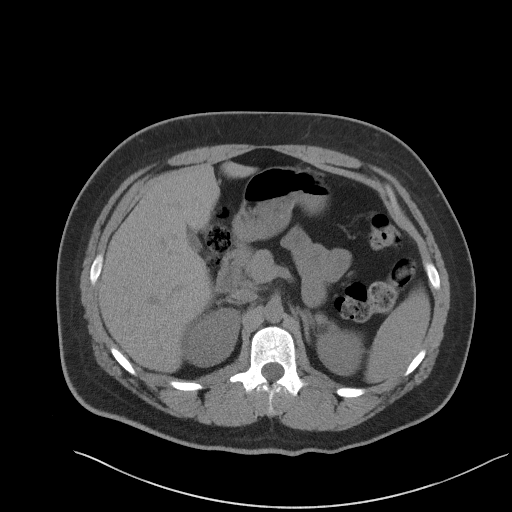
[im 88/109  soft-tissue]
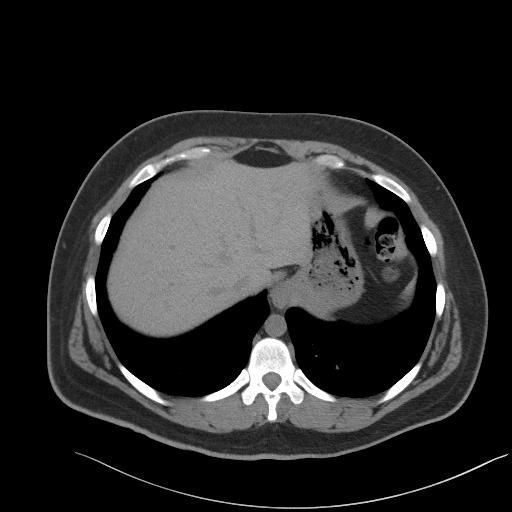
[im 95/109  soft-tissue]
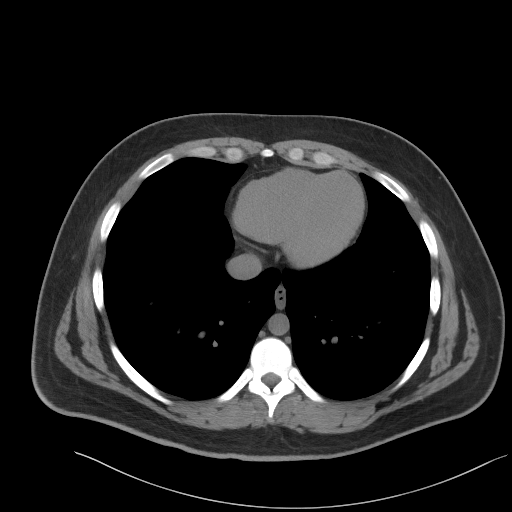
[im 102/109  soft-tissue]
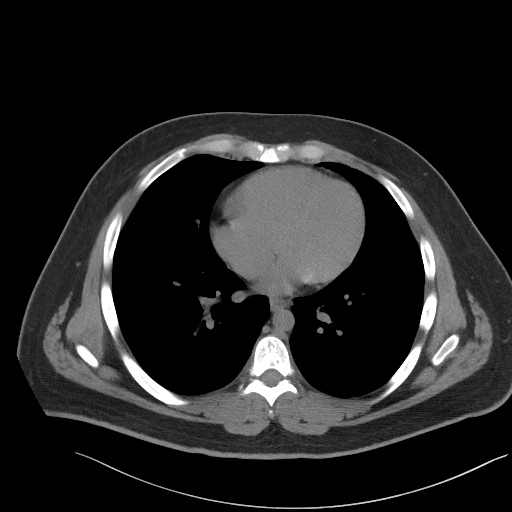

[Series 5: coronal st · coronal · 0.81mm/px · 3 of 108 slices shown]
[im 36/108  soft-tissue]
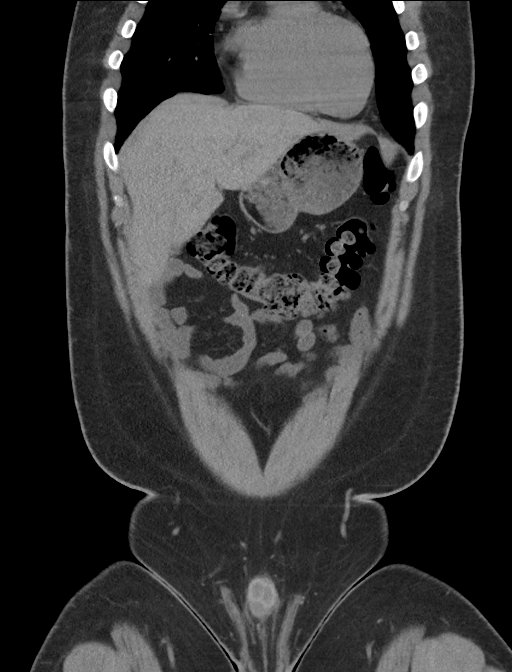
[im 48/108  soft-tissue]
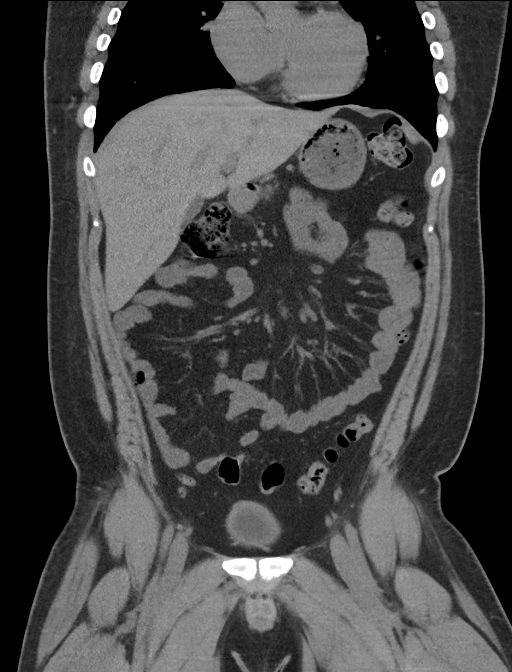
[im 60/108  soft-tissue]
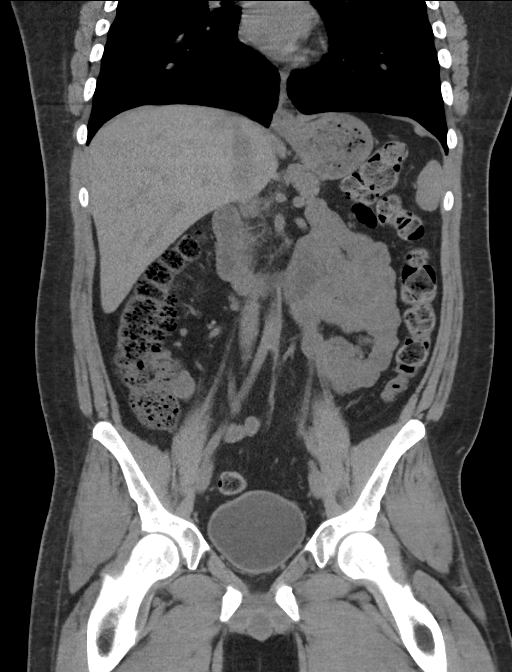

[16 of 46 positions shown; findings below may reference images not displayed]

FINDINGS: Lower chest: Lung bases are clear.

Hepatobiliary: Unenhanced liver is unremarkable.

Gallbladder is unremarkable. No intrahepatic or extrahepatic ductal
dilatation.

Pancreas: Within normal limits.

Spleen: Within normal limits.

Adrenals/Urinary Tract: Adrenal glands are within normal limits.

Kidneys are within normal limits. No renal, ureteral, or bladder
calculi. No hydronephrosis.

Bladder is mildly thick-walled although underdistended.

Stomach/Bowel: Stomach is within normal limits.

No evidence of bowel obstruction.

Appendix is mildly prominent in its midportion (series 2/image 36),
but this is unchanged from the prior and without associated
inflammatory changes.

Vascular/Lymphatic: No evidence of abdominal aortic aneurysm.

No suspicious abdominopelvic lymphadenopathy.

Reproductive: Prostate is unremarkable.

Other: No abdominopelvic ascites.

Musculoskeletal: Visualized osseous structures are within normal
limits.
IMPRESSION: Negative CT abdomen/pelvis.
